# Patient Record
Sex: Female | Born: 1965 | Race: White | Hispanic: No | Marital: Married | Smoking: Former smoker
Health system: Southern US, Community
[De-identification: ages and names within clinical notes are randomized; demographics above are authoritative.]

## PROBLEM LIST (undated history)

## (undated) DIAGNOSIS — M545 Low back pain, unspecified: Secondary | ICD-10-CM

## (undated) DIAGNOSIS — K219 Gastro-esophageal reflux disease without esophagitis: Secondary | ICD-10-CM

## (undated) DIAGNOSIS — F419 Anxiety disorder, unspecified: Secondary | ICD-10-CM

## (undated) DIAGNOSIS — N6019 Diffuse cystic mastopathy of unspecified breast: Secondary | ICD-10-CM

## (undated) DIAGNOSIS — D509 Iron deficiency anemia, unspecified: Secondary | ICD-10-CM

## (undated) DIAGNOSIS — J45909 Unspecified asthma, uncomplicated: Secondary | ICD-10-CM

## (undated) DIAGNOSIS — M1990 Unspecified osteoarthritis, unspecified site: Secondary | ICD-10-CM

## (undated) DIAGNOSIS — J309 Allergic rhinitis, unspecified: Secondary | ICD-10-CM

## (undated) HISTORY — DX: Iron deficiency anemia, unspecified: D50.9

## (undated) HISTORY — DX: Unspecified asthma, uncomplicated: J45.909

## (undated) HISTORY — DX: Diffuse cystic mastopathy of unspecified breast: N60.19

## (undated) HISTORY — DX: Low back pain, unspecified: M54.50

## (undated) HISTORY — DX: Anxiety disorder, unspecified: F41.9

## (undated) HISTORY — DX: Low back pain: M54.5

## (undated) HISTORY — DX: Unspecified osteoarthritis, unspecified site: M19.90

## (undated) HISTORY — DX: Allergic rhinitis, unspecified: J30.9

## (undated) HISTORY — DX: Gastro-esophageal reflux disease without esophagitis: K21.9

---

## 2005-12-11 HISTORY — PX: CHOLECYSTECTOMY: SHX55

## 2006-12-11 HISTORY — PX: OTHER SURGICAL HISTORY: SHX169

## 2014-04-09 ENCOUNTER — Telehealth: Payer: Self-pay | Admitting: Critical Care Medicine

## 2014-04-09 NOTE — Telephone Encounter (Signed)
Pt has an appt scheduled with PW on 04/14/14 at 4:15 pm in [place] for a consult.   PW will not be in office for this appt. I have lmomtcb for pt to see if she can ARRIVE at 3:15 pm for a 3:45 pm appt time with Dr. Nelda Marseille.  Also, when pt calls back, can we please add demographics.

## 2014-04-10 NOTE — Telephone Encounter (Signed)
lmomtcb for pt 

## 2014-04-10 NOTE — Telephone Encounter (Signed)
Spoke with pt.  She can come in on May 5 at 3:15 for 3:45 pm appt with Dr. Nelda Marseille.  She is aware to bring all meds to visit.  She verbalized understanding and voiced no further questions or concerns at this time.

## 2014-04-14 ENCOUNTER — Ambulatory Visit (INDEPENDENT_AMBULATORY_CARE_PROVIDER_SITE_OTHER): Payer: Self-pay | Admitting: Pulmonary Disease

## 2014-04-14 ENCOUNTER — Encounter: Payer: Self-pay | Admitting: Pulmonary Disease

## 2014-04-14 VITALS — BP 110/78 | HR 64 | Ht 62.0 in | Wt 192.4 lb

## 2014-04-14 DIAGNOSIS — R0609 Other forms of dyspnea: Secondary | ICD-10-CM

## 2014-04-14 DIAGNOSIS — K219 Gastro-esophageal reflux disease without esophagitis: Secondary | ICD-10-CM

## 2014-04-14 DIAGNOSIS — R062 Wheezing: Secondary | ICD-10-CM | POA: Insufficient documentation

## 2014-04-14 DIAGNOSIS — R091 Pleurisy: Secondary | ICD-10-CM

## 2014-04-14 MED ORDER — FLUTTER DEVI: Status: AC

## 2014-04-14 NOTE — Progress Notes (Signed)
   Subjective:    Patient ID: Kaitlyn Maldonado, female    DOB: [birth year], 48 y.o.   MRN: [id number]  HPI    Review of Systems  Constitutional: Positive for unexpected weight change. Negative for fever.  HENT: Positive for sneezing and trouble swallowing. Negative for congestion, dental problem, ear pain, nosebleeds, postnasal drip, rhinorrhea, sinus pressure and sore throat.   Eyes: Positive for redness and itching.  Respiratory: Positive for cough and shortness of breath. Negative for chest tightness and wheezing.   Cardiovascular: Positive for chest pain and leg swelling. Negative for palpitations.  Gastrointestinal: Negative for nausea and vomiting.  Genitourinary: Negative for dysuria.  Musculoskeletal: Negative for joint swelling.  Skin: Negative for rash.  Neurological: Positive for headaches.  Hematological: Does not bruise/bleed easily.  Psychiatric/Behavioral: Negative for dysphoric mood. The patient is not nervous/anxious.        Objective:   Physical Exam        Assessment & Plan:

## 2014-04-14 NOTE — Patient Instructions (Signed)
Use flutter valve as advised.  Obtain pulmonary function testing.  Tylenol for CP.  Chest X-ray has been ordered for you.  Continue Mucinex.  Return to clinic in 4 weeks after pulmonary function testing and chest X-ray have been done.

## 2014-04-14 NOTE — Progress Notes (Signed)
Subjective:    Patient ID: Kaitlyn Maldonado, female    DOB: May 03, 1966, 48 y.o.   MRN: [id number]  Shortness of Breath This is a chronic problem. The current episode started more than 1 month ago. The problem occurs constantly. The problem has been unchanged. The average episode lasts 6 months. Associated symptoms include chest pain and sputum production. Pertinent negatives include no abdominal pain, claudication, ear pain, fever, headaches, hemoptysis, leg pain, leg swelling, neck pain, orthopnea, PND, rash, rhinorrhea, sore throat, swollen glands, syncope, vomiting or wheezing. The symptoms are aggravated by exercise and smoke. Associated symptoms comments: - Dull pain at the base on the right when on inhalation, pleuritic in nature. - Thick clear mucous and improves with mucinex. - Clear sputum production.. The patient has no known risk factors for DVT/PE. He has tried beta agonist inhalers for the symptoms. The treatment provided mild relief. His past medical history is significant for pneumonia. There is no history of asthma, bronchiolitis or COPD.  Increase sputum production in the AM.  GERD in AM.  Continously clearing throat.  No post nasal drip.  Seen by allergy doctor and was all negative for testing.  Was given symbicort by her primary doctor with little effect.  Albuterol seems to help with rattle in her chest.  Pleuritic chest pain as above.  Review of Systems  Constitutional: Negative for fever.  HENT: Negative for ear pain, rhinorrhea and sore throat.   Respiratory: Positive for sputum production and shortness of breath. Negative for hemoptysis and wheezing.   Cardiovascular: Positive for chest pain. Negative for orthopnea, claudication, leg swelling, syncope and PND.  Gastrointestinal: Negative for vomiting and abdominal pain.  Musculoskeletal: Negative for neck pain.  Skin: Negative for rash.  Neurological: Negative for headaches.     Objective:   Physical Exam Filed Vitals:   04/14/14 1617  BP: 110/78  Pulse: 64  Height: 5\' 2"  (1.575 m)  Weight: 192 lb 6.4 oz (87.272 kg)  SpO2: 97%    Gen: Pleasant, well-nourished, in no distress,  normal affect  ENT: No lesions,  mouth clear,  oropharynx clear, no postnasal drip  Neck: No JVD, no TMG, no carotid bruits  Lungs: No use of accessory muscles, no dullness to percussion, rales bibabasaly.  Minimal wheezing that clears with deep cough.  Cardiovascular: RRR, heart sounds normal, no murmur or gallops, no peripheral edema  Abdomen: soft and NT, no HSM,  BS normal  Musculoskeletal: No deformities, no cyanosis or clubbing  Neuro: alert, non focal  Skin: Warm, no lesions or rashes  No results found.        Assessment & Plan:   48 year old female with no significant pulmonary history who had 2 bouts of pneumonia last summer and ever since then has been complaining of rattle in her chest that has gotten worse c/o pleurisy since.  Patient has a very strong reflux history, previous EGD and multiple anti-reflux drugs.  I am concerned that all her symptoms are related to reflux and are not at all related to pulmonary disease perse.  Having said that she does have a smoking history.  I feel this is an upper airway cough syndrome.  Will start by evaluating for obstructive lung disease, she is already on BID PPI and H2 blocker.  If no obstructive lung disease then will add a nasal steroid but patient has been on a nasal steroid in the past with little effect.  Plan: - Need to R/O any inherent pulmonary  disease.  Will order full PFTs. - With regards to pleurisy, I believe that the pleurisy is related to frequent cough induced rib pain, therefore advised to use tylenol as the patient has history of gastric ulcers in the past.  If having to use any NSAID then enteric coated only and on a full stomach. - Repeat CXR to insure resolution of PNA and that there is no pleural effusion that can be the cause of the pleurisy. - If  PFTs are normal and CXR is clear then will need to re-evaluate her GERD and her pyloric sphincter function via seeing a GI doctor. - If no obstructive lung disease then will add a nasal steroid. - Also advised to use a flutter valve to help with mucous clearing specially in the morning.  Rush Farmer, M.D. [facility].

## 2014-04-15 ENCOUNTER — Telehealth: Payer: Self-pay | Admitting: Pulmonary Disease

## 2014-04-15 NOTE — Telephone Encounter (Signed)
Spoke with patient- she states the Flutter Device Rx she was given is not covered by her insurance at the pharmacy. She states Crystal told her to call our office if she had any trouble and she could get her one form the [place] office. Pt would like to get one from [place] office but pick it up in [place] office. Pt is aware that I am speaking with Crystal to find out when they will be back in the [place] office. Pt requests we call and leave a detailed message on phone number provided or call her after 3pm today. Crystal please advise. Thanks.

## 2014-04-15 NOTE — Telephone Encounter (Signed)
LMTC x 1  

## 2014-04-15 NOTE — Telephone Encounter (Signed)
I ATC Sherry - line rang several times with no option for VM to personally verify msg with Judeen Hammans. WCB  Once I speak with Judeen Hammans, I will call Dr. Nelda Marseille.

## 2014-04-16 NOTE — Telephone Encounter (Signed)
I spoke with Dr. Nelda Marseille.  He is ok with [facility] proceeding with a Full PFT only right now as long as they do a pre and post. Spoke with Judeen Hammans.  Informed her of above.  She verbalized understanding and states no new order is needed.  States she will make a comment of this on the current order form they have. Nothing further needed. Will sign off.

## 2014-04-16 NOTE — Telephone Encounter (Signed)
I spoke with Judeen Hammans with [facility]FT lab - they do not have a policy and procedure in place to be able to do methacholine test on Friday for this pt.  Would we like them to do a full PFT only or does pt need to be referred elsewhere for this test?  Advised I would speak with Dr. Nelda Marseille later today and would call her back.  She is ok with this.  Sherry's office # [phone]

## 2014-04-22 NOTE — Telephone Encounter (Signed)
Crystal please advise thanks

## 2014-04-22 NOTE — Telephone Encounter (Signed)
lmomtcb for pt.  We are not back in [place] until Tuesday, May 19.  I will ensure we have a flutter valve in the office on that day.  Can pt come in at 8:45 to pick up and be shown how to use this?  Also, can she bring rx with her so we can sent to DME.

## 2014-04-22 NOTE — Telephone Encounter (Signed)
Pt returned call. Please call back after 3

## 2014-04-22 NOTE — Telephone Encounter (Signed)
lmomtcb x1 for pt 

## 2014-04-22 NOTE — Telephone Encounter (Signed)
Spoke with pt and offered her to come into the [place] office to obtain flutter valve sooner. Pt stated she would wait and come to [place] office 04/28/14 @ 8:45 and brind rx for flutter with her.   Pt had chest cxr done in at [facility] on 04/15/14 and she is requesting these results.  Pt request to be called at the number below after 3:00  Will forward message to crystal to follow up with JY on results.

## 2014-04-22 NOTE — Telephone Encounter (Signed)
Pt request call back after 3. cb at [phone].

## 2014-04-23 NOTE — Telephone Encounter (Signed)
I spoke with Dr. Nelda Marseille. He will call pt regarding cxr results.

## 2014-04-24 LAB — PULMONARY FUNCTION TEST

## 2014-04-27 NOTE — Telephone Encounter (Signed)
Dr. Nelda Marseille has reviewed pt's cxr results.  Per JY:  Call pt, please let her know cxr is normal.  Called, spoke with pt.  Informed her of cxr results per JY.  She verbalized understanding and voiced no further questions or concerns at this time.  Pt will still come by [place] office in the morning to pick up flutter valve.  Will route to [place] to review and sign off on cxr results.

## 2014-04-28 NOTE — Telephone Encounter (Signed)
Pt  Came by [place] office today to pick up flutter valve.  She was shown how to use this.  She verbalized understanding and voiced no further questions or concerns at this time.  Will route back to [place] to sign off on cxr results.

## 2014-05-06 ENCOUNTER — Telehealth: Payer: Self-pay | Admitting: Pulmonary Disease

## 2014-05-06 NOTE — Telephone Encounter (Signed)
Pt had PFTs done on 04/24/14 at [facility].  Dr. Nelda Marseille has reviewed this results. Per JY: Call pt, let her know these results are normal. lmomtcb for pt PFT results placed in scan folder.

## 2014-05-06 NOTE — Telephone Encounter (Signed)
Please advise if this has been taken care of. Thanks.  

## 2014-05-06 NOTE — Telephone Encounter (Signed)
Yes, Dr. Nelda Marseille has reviewed msg and cxr results.  Nothing further needed.  Will sign off.

## 2014-05-07 NOTE — Telephone Encounter (Signed)
Called, spoke with pt.  Informed her of pft results per Dr. Nelda Marseille.  She verbalized understanding and voiced no further questions or concerns at this time.

## 2014-05-11 ENCOUNTER — Telehealth: Payer: Self-pay | Admitting: *Deleted

## 2014-05-11 NOTE — Telephone Encounter (Signed)
Made pt an appt on Thursday to see Nelda Marseille.

## 2014-05-11 NOTE — Telephone Encounter (Signed)
lmomtcb for pt to schedule 4 wk follow up.  Dr. Nelda Marseille will be in [place] on this Thursday, June 4.  Can pt come in for f/u then?  She has already had cxr and pfts done.

## 2014-05-11 NOTE — Telephone Encounter (Signed)
Message copied by Adalberto Ill on Mon May 11, 2014  9:07 AM ------      Message from: Raymondo Band D      Created: Tue Apr 14, 2014  5:38 PM      Regarding: needs ov       Needs OV with yacoub in 4 wks from 04/14/14.      PFT and cxr needs to be done prior to appt. ------

## 2014-05-14 ENCOUNTER — Ambulatory Visit: Payer: Self-pay | Admitting: Pulmonary Disease

## 2014-05-19 ENCOUNTER — Encounter: Payer: Self-pay | Admitting: Critical Care Medicine

## 2014-05-19 ENCOUNTER — Ambulatory Visit (INDEPENDENT_AMBULATORY_CARE_PROVIDER_SITE_OTHER): Payer: Self-pay | Admitting: Critical Care Medicine

## 2014-05-19 VITALS — BP 130/80 | HR 62 | Temp 98.2°F | Ht 62.5 in | Wt 193.2 lb

## 2014-05-19 DIAGNOSIS — R0609 Other forms of dyspnea: Secondary | ICD-10-CM

## 2014-05-19 DIAGNOSIS — J309 Allergic rhinitis, unspecified: Secondary | ICD-10-CM | POA: Insufficient documentation

## 2014-05-19 NOTE — Patient Instructions (Signed)
Refrigerate fish oil and reduce to one daily Can stop proair Stay on stomach medication and antihistamines Return as needed

## 2014-05-19 NOTE — Assessment & Plan Note (Signed)
Dyspnea due to prior PNA now resolved PFTs normal. CXR clear No primary lung disease at present Plan Refrigerate fish oil and reduce to one daily Can stop proair Stay on PPI and antihistamines Return as needed

## 2014-05-19 NOTE — Progress Notes (Signed)
Subjective:    Patient ID: Kaitlyn Maldonado, female    DOB: 29-May-1966, 48 y.o.   MRN: [id number]  HPIIncrease sputum production in the AM.  GERD in AM.  Continously clearing throat.  No post nasal drip.  Seen by allergy doctor and was all negative for testing.  Was given symbicort by her primary doctor with little effect.  Albuterol seems to help with rattle in her chest.  Pleuritic chest pain as above.  05/19/2014 Chief Complaint  Patient presents with  . Follow-up    saw Dr. Nelda Marseille last mth.,feeling better 75%,had UTI went to Urgent Care, finished abx Augmentin 3 days ago, flutter device helping,mild sob,dry cough, no wheeze,chest tightness midchest,still hurts when breathes deep under bilat. ribs   Last seen one month ago and rec was : 48 year old female with no significant pulmonary history who had 2 bouts of pneumonia last summer and ever since then has been complaining of rattle in her chest that has gotten worse c/o pleurisy since.  Patient has a very strong reflux history, previous EGD and multiple anti-reflux drugs.  I am concerned that all her symptoms are related to reflux and are not at all related to pulmonary disease perse.  Having said that she does have a smoking history.  I feel this is an upper airway cough syndrome.  Will start by evaluating for obstructive lung disease, she is already on BID PPI and H2 blocker.  If no obstructive lung disease then will add a nasal steroid but patient has been on a nasal steroid in the past with little effect.  Plan: - Need to R/O any inherent pulmonary disease.  Will order full PFTs. - With regards to pleurisy, I believe that the pleurisy is related to frequent cough induced rib pain, therefore advised to use tylenol as the patient has history of gastric ulcers in the past.  If having to use any NSAID then enteric coated only and on a full stomach. - Repeat CXR to insure resolution of PNA and that there is no pleural effusion that can be the cause  of the pleurisy. - If PFTs are normal and CXR is clear then will need to re-evaluate her GERD and her pyloric sphincter function via seeing a GI doctor. - If no obstructive lung disease then will add a nasal steroid. - Also advised to use a flutter valve to help with mucous clearing specially in the morning.  No further pleurisy. Pt still with tightness with cough.  Still dry cough. Pt saw allergy MD and ? Stomach related No heartburn now, but on med for same. No swallow issues. Notes some pndrip  Review of Systems   Objective:   Physical Exam  Filed Vitals:   05/19/14 0852  BP: 130/80  Pulse: 62  Temp: 98.2 F (36.8 C)  TempSrc: Oral  Height: 5' 2.5" (1.588 m)  Weight: 193 lb 3.2 oz (87.635 kg)  SpO2: 96%    Gen: Pleasant, well-nourished, in no distress,  normal affect  ENT: No lesions,  mouth clear,  oropharynx clear, no postnasal drip  Neck: No JVD, no TMG, no carotid bruits  Lungs: No use of accessory muscles, no dullness to percussion, rales bibabasaly.  Minimal wheezing that clears with deep cough.  Cardiovascular: RRR, heart sounds normal, no murmur or gallops, no peripheral edema  Abdomen: soft and NT, no HSM,  BS normal  Musculoskeletal: No deformities, no cyanosis or clubbing  Neuro: alert, non focal  Skin: Warm, no lesions  or rashes  No results found.        Assessment & Plan:   DOE (dyspnea on exertion) Dyspnea due to prior PNA now resolved PFTs normal. CXR clear No primary lung disease at present Plan Refrigerate fish oil and reduce to one daily Can stop proair Stay on PPI and antihistamines Return as needed     Updated Medication List Outpatient Encounter Prescriptions as of 05/19/2014  Medication Sig  . albuterol (PROAIR HFA) 108 (90 BASE) MCG/ACT inhaler Inhale 2 puffs into the lungs every 6 (six) hours as needed for wheezing or shortness of breath.  . Cholecalciferol (VITAMIN D) 2000 UNITS CAPS Take by mouth daily.  . Fe  Fum-FePoly-Vit C-Vit B3 (INTEGRA PO) Take by mouth daily.  . lansoprazole (PREVACID) 30 MG capsule Take 30 mg by mouth 2 (two) times daily.  Marland Kitchen loratadine (CLARITIN) 10 MG tablet Take 10 mg by mouth 2 (two) times daily.  . montelukast (SINGULAIR) 10 MG tablet Take 10 mg by mouth at bedtime.  . Omega-3 Fatty Acids (FISH OIL) 1200 MG CAPS Take by mouth 2 (two) times daily.  . ranitidine (ZANTAC) 300 MG tablet Take 300 mg by mouth at bedtime.  Marland Kitchen Respiratory Therapy Supplies (FLUTTER) DEVI Use as directed four times daily  . traMADol (ULTRAM) 50 MG tablet Take 50 mg by mouth 3 (three) times daily as needed.  . meloxicam (MOBIC) 7.5 MG tablet Take 7.5 mg by mouth 2 (two) times daily.  Marland Kitchen orlistat (ALLI) 60 MG capsule Take 120-180 mg by mouth 3 (three) times daily with meals.

## 2014-06-11 ENCOUNTER — Encounter: Payer: Self-pay | Admitting: Critical Care Medicine

## 2015-09-16 ENCOUNTER — Other Ambulatory Visit: Payer: Self-pay | Admitting: Physician Assistant

## 2015-09-16 DIAGNOSIS — M5417 Radiculopathy, lumbosacral region: Secondary | ICD-10-CM

## 2015-10-02 ENCOUNTER — Ambulatory Visit
Admission: RE | Admit: 2015-10-02 | Discharge: 2015-10-02 | Disposition: A | Discharge status: Home / Self Care | Payer: Managed Care, Other (non HMO) | Source: Ambulatory Visit | Attending: Physician Assistant | Admitting: Physician Assistant

## 2015-10-02 DIAGNOSIS — M5417 Radiculopathy, lumbosacral region: Secondary | ICD-10-CM

## 2015-11-08 ENCOUNTER — Ambulatory Visit: Payer: Managed Care, Other (non HMO) | Admitting: Podiatry

## 2016-03-22 DIAGNOSIS — D473 Essential (hemorrhagic) thrombocythemia: Secondary | ICD-10-CM | POA: Diagnosis not present

## 2016-03-29 ENCOUNTER — Encounter: Payer: Self-pay | Admitting: Sports Medicine

## 2016-03-29 ENCOUNTER — Ambulatory Visit (INDEPENDENT_AMBULATORY_CARE_PROVIDER_SITE_OTHER): Payer: Managed Care, Other (non HMO)

## 2016-03-29 ENCOUNTER — Ambulatory Visit (INDEPENDENT_AMBULATORY_CARE_PROVIDER_SITE_OTHER): Payer: Managed Care, Other (non HMO) | Admitting: Sports Medicine

## 2016-03-29 DIAGNOSIS — M722 Plantar fascial fibromatosis: Secondary | ICD-10-CM

## 2016-03-29 DIAGNOSIS — M79673 Pain in unspecified foot: Secondary | ICD-10-CM

## 2016-03-29 MED ORDER — TRIAMCINOLONE ACETONIDE 10 MG/ML IJ SUSP: 10.0000 mg | Freq: Once | INTRAMUSCULAR | Status: AC

## 2016-03-29 MED ORDER — METHYLPREDNISOLONE 4 MG PO TBPK: ORAL_TABLET | ORAL | Status: DC

## 2016-03-29 NOTE — Patient Instructions (Signed)

## 2016-03-29 NOTE — Progress Notes (Signed)
Patient ID: Kaitlyn Maldonado, female   DOB: 07-09-1966, 50 y.o.   MRN: [id number] Subjective: Kaitlyn Maldonado is a 50 y.o. female patient presents to office with complaint of heel pain on the left and right. 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 Patient has treated this problem with elastic sock which helped some. Patient reports that because of pain in heels she has changed the way she walks and occasionally has pain at lateral foot and ball R>L.   Patient Active Problem List   Diagnosis Date Noted  . Allergic rhinitis 05/19/2014  . GERD (gastroesophageal reflux disease) 04/14/2014  . DOE (dyspnea on exertion) 04/14/2014    Current Outpatient Prescriptions on File Prior to Visit  Medication Sig Dispense Refill  . albuterol (PROAIR HFA) 108 (90 BASE) MCG/ACT inhaler Inhale 2 puffs into the lungs every 6 (six) hours as needed for wheezing or shortness of breath.    . Cholecalciferol (VITAMIN D) 2000 UNITS CAPS Take by mouth daily.    . Fe Fum-FePoly-Vit C-Vit B3 (INTEGRA PO) Take by mouth daily.    . lansoprazole (PREVACID) 30 MG capsule Take 30 mg by mouth 2 (two) times daily.    Marland Kitchen loratadine (CLARITIN) 10 MG tablet Take 10 mg by mouth 2 (two) times daily.    . meloxicam (MOBIC) 7.5 MG tablet Take 7.5 mg by mouth 2 (two) times daily.    . montelukast (SINGULAIR) 10 MG tablet Take 10 mg by mouth at bedtime.    . Omega-3 Fatty Acids (FISH OIL) 1200 MG CAPS Take by mouth 2 (two) times daily.    . Ondansetron HCl (ZOFRAN PO) Take by mouth as needed.    Marland Kitchen orlistat (ALLI) 60 MG capsule Take 120-180 mg by mouth 3 (three) times daily with meals.    . ranitidine (ZANTAC) 300 MG tablet Take 300 mg by mouth at bedtime.    Marland Kitchen Respiratory Therapy Supplies (FLUTTER) DEVI Use as directed four times daily 1 each 0  . traMADol (ULTRAM) 50 MG tablet Take 50 mg by mouth 3 (three) times daily as needed.     No current facility-administered medications on file prior to visit.    Allergies   Allergen Reactions  . Keflex [Cephalexin]     Rash, dizziness  . Sulfa Antibiotics     rash    Objective: Physical Exam General: The patient is alert and oriented x3 in no acute distress.  Dermatology: Skin is warm, dry and supple bilateral lower extremities. Nails 1-10 are normal. There is no erythema, edema, no eccymosis, no open lesions present. Integument is otherwise unremarkable.  Vascular: Dorsalis Pedis pulse and Posterior Tibial pulse are 2/4 bilateral. Capillary fill time is immediate to all digits.  Neurological: Grossly intact to light touch with an achilles reflex of +2/5 and a  negative Tinel's sign bilateral.  Musculoskeletal: Tenderness to palpation at the medial calcaneal tubercale and through the insertion of the plantar fascia on the left and right foot. No pain with compression of calcaneus bilateral. No pain with tuning fork to calcaneus bilateral. No pain with calf compression bilateral.  All  joints range of motion within normal limits bilateral. Strength 5/5 in all groups bilateral.   Xray, Right and Left foot:  Normal osseous mineralization. Joint spaces preserved. No fracture/dislocation/boney destruction. Mild hammertoe, Calcaneal spur present with mild thickening of plantar fascia. No other soft tissue abnormalities or radiopaque foreign bodies.   Assessment and Plan: Problem List Items Addressed This Visit  None    Visit Diagnoses    Foot pain, unspecified laterality    -  Primary    Relevant Medications    triamcinolone acetonide (KENALOG) 10 MG/ML injection 10 mg    methylPREDNISolone (MEDROL DOSEPAK) 4 MG TBPK tablet    Other Relevant Orders    DG Foot 2 Views Left    DG Foot 2 Views Right    Plantar fasciitis, bilateral        Relevant Medications    triamcinolone acetonide (KENALOG) 10 MG/ML injection 10 mg    methylPREDNISolone (MEDROL DOSEPAK) 4 MG TBPK tablet       -Complete examination performed. Discussed with patient in detail the  condition of plantar fasciitis, how this occurs and general treatment options. Explained both conservative and surgical treatments.  -After oral consent and aseptic prep, injected a mixture containing 1 ml of 2%  plain lidocaine, 1 ml 0.5% plain marcaine, 0.5 ml of kenalog 10 and 0.5 ml of dexamethasone phosphate into left and right heel. Post-injection care discussed with patient.  -Rx Medrol dose pack once completed patient to take Mobic of which she already has -Recommended good supportive shoes and advised use of OTC insert. Explained to patient that if these orthoses work well, we will continue with these. If these do not improve her condition and  pain, we will consider custom molded orthoses. -Explained in detail the use of the fascial braces which was dispensed at today's visit. -Explained and dispensed to patient daily stretching exercises. -Recommend patient to ice affected area 1-2x daily. -Patient to return to office in 3 weeks for follow up or sooner if problems or questions arise.  Kaitlyn Maldonado, DPM

## 2016-04-19 ENCOUNTER — Encounter: Payer: Self-pay | Admitting: Sports Medicine

## 2016-04-19 ENCOUNTER — Ambulatory Visit (INDEPENDENT_AMBULATORY_CARE_PROVIDER_SITE_OTHER): Payer: Managed Care, Other (non HMO) | Admitting: Sports Medicine

## 2016-04-19 DIAGNOSIS — M79673 Pain in unspecified foot: Secondary | ICD-10-CM

## 2016-04-19 DIAGNOSIS — M722 Plantar fascial fibromatosis: Secondary | ICD-10-CM | POA: Diagnosis not present

## 2016-04-19 MED ORDER — TRIAMCINOLONE ACETONIDE 10 MG/ML IJ SUSP: 10.0000 mg | Freq: Once | INTRAMUSCULAR | Status: AC

## 2016-04-19 NOTE — Progress Notes (Signed)
Patient ID: Kaitlyn Maldonado, female   DOB: 12-Nov-1966, 50 y.o.   MRN: [id number] Subjective: Kaitlyn Maldonado is a 50 y.o. female returns to office for follow up evaluation after Left and Right heel injection for plantar fasciitis, injection #1 administered 3 weeks ago. Patient states that the injection seems to help her pain for about 2 weeks but heels still hurt after working more hours now. Patient denies any recent changes in medications or new problems since last visit.   Patient Active Problem List   Diagnosis Date Noted  . Allergic rhinitis 05/19/2014  . GERD (gastroesophageal reflux disease) 04/14/2014  . DOE (dyspnea on exertion) 04/14/2014    Current Outpatient Prescriptions on File Prior to Visit  Medication Sig Dispense Refill  . albuterol (PROAIR HFA) 108 (90 BASE) MCG/ACT inhaler Inhale 2 puffs into the lungs every 6 (six) hours as needed for wheezing or shortness of breath.    . Cholecalciferol (VITAMIN D) 2000 UNITS CAPS Take by mouth daily.    . clobetasol (TEMOVATE) 0.05 % external solution APLY TO DAMP SCALP BID FOR 1 TO 2 WEEKS PRN  1  . cyclobenzaprine (FLEXERIL) 5 MG tablet     . dicyclomine (BENTYL) 20 MG tablet     . doxycycline (VIBRAMYCIN) 100 MG capsule TK 1 C PO BID  0  . Fe Fum-FePoly-Vit C-Vit B3 (INTEGRA PO) Take by mouth daily.    . lansoprazole (PREVACID) 30 MG capsule Take 30 mg by mouth 2 (two) times daily.    Marland Kitchen loratadine (CLARITIN) 10 MG tablet Take 10 mg by mouth 2 (two) times daily.    . meloxicam (MOBIC) 7.5 MG tablet Take 7.5 mg by mouth 2 (two) times daily.    . methylPREDNISolone (MEDROL DOSEPAK) 4 MG TBPK tablet Take as instructed 21 tablet 0  . montelukast (SINGULAIR) 10 MG tablet Take 10 mg by mouth at bedtime.    . Omega-3 Fatty Acids (FISH OIL) 1200 MG CAPS Take by mouth 2 (two) times daily.    . Ondansetron HCl (ZOFRAN PO) Take by mouth as needed.    Marland Kitchen orlistat (ALLI) 60 MG capsule Take 120-180 mg by mouth 3 (three) times daily with meals.    .  pantoprazole (PROTONIX) 40 MG tablet     . phentermine (ADIPEX-P) 37.5 MG tablet TK 1 T PO QAM  0  . predniSONE (STERAPRED UNI-PAK 21 TAB) 5 MG (21) TBPK tablet TK UTD FOR 6 DAYS  0  . ranitidine (ZANTAC) 300 MG tablet Take 300 mg by mouth at bedtime.    Marland Kitchen Respiratory Therapy Supplies (FLUTTER) DEVI Use as directed four times daily 1 each 0  . SYNTHROID 50 MCG tablet     . traMADol (ULTRAM) 50 MG tablet Take 50 mg by mouth 3 (three) times daily as needed.    . triamcinolone cream (KENALOG) 0.5 % APP EXT AA BID  0  . triamcinolone ointment (KENALOG) 0.1 % APP TO ARMS BID PRF ITCHING  1   Current Facility-Administered Medications on File Prior to Visit  Medication Dose Route Frequency Provider Last Rate Last Dose  . triamcinolone acetonide (KENALOG) 10 MG/ML injection 10 mg  10 mg Other Once [name], DPM        Allergies  Allergen Reactions  . Keflex [Cephalexin]     Rash, dizziness  . Sulfa Antibiotics     rash    Objective:   General:  Alert and oriented x 3, in no acute distress  Dermatology: Skin is warm,  dry, and supple bilateral. Nails are within normal limits. There is no lower extremity erythema, no eccymosis, no open lesions present bilateral.   Vascular: Dorsalis Pedis and Posterior Tibial pedal pulses are 2/4 bilateral. + hair growth noted bilateral. Capillary Fill Time is 3 seconds in all digits. No varicosities, No edema bilateral lower extremities.   Neurological: Sensation grossly intact to light touch with an achilles reflex of +2 and a  negative Tinel's sign bilateral. Vibratory, sharp/dull, Semmes Weinstein Monofilament within normal limits.   Musculoskeletal: There is decreased tenderness to palpation at the medial calcaneal tubercale and through the insertion of the plantar fascia on the Left and right foot. No pain with compression to calcaneus or application of tuning fork. There is decreased Ankle joint range of motion bilateral. All other jointsrange of  motion  within normal limits bilateral. Strength 5/5 bilateral.   Assessment and Plan: Problem List Items Addressed This Visit    None    Visit Diagnoses    Plantar fasciitis, bilateral    -  Primary    Foot pain, unspecified laterality           -Complete examination performed.  -Previous x-rays reviewed. -Discussed with patient in detail the condition of plantar fasciitis, how this  occurs related to the foot type of the patient and general treatment options. - Patient opted for another injection today; injection #2 to heels; After oral consent and aseptic prep, injected a mixture containing 0.5 ml of 1%plain lidocaine, 0.5 ml 0.25% plain marcaine, 0.5 ml of kenalog 10 and 0.5 ml of dexmethasone phosphate to left and right heel at area of most pain/trigger point injection. -Dispensed night splint x 1 to alternate as instructed between both sides -Continue with mobic -Continue with fascial brace or compression sock and stretching, icing, good supportive shoes, inserts daily.  -Discussed long term care and reocurrence; will closely monitor; if fails to improve will consider other treatment modalities.  -Patient to return to office in 1 month for follow up or sooner if problems or questions arise.  [name], DPM

## 2016-05-10 ENCOUNTER — Ambulatory Visit: Payer: Managed Care, Other (non HMO) | Admitting: Sports Medicine

## 2017-07-29 IMAGING — MR MR LUMBAR SPINE W/O CM
4 of 5 series · 26 of 48 positions shown · non-contrast
Comparison: None.

CLINICAL DATA: Chronic central as low back pain and numbness
predominantly affecting the right lower extremity.

EXAM:
MRI LUMBAR SPINE WITHOUT CONTRAST
TECHNIQUE: Multiplanar, multisequence MR imaging of the lumbar spine was
performed. No intravenous contrast was administered.

[Series 3: T2 · sagittal · 4.0mm · 0.55mm/px · 6 of 13 slices shown (1 of 2)]
[im 1/13]
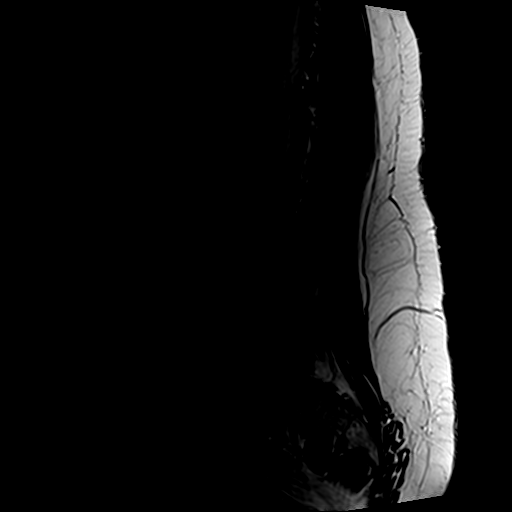
[im 3/13]
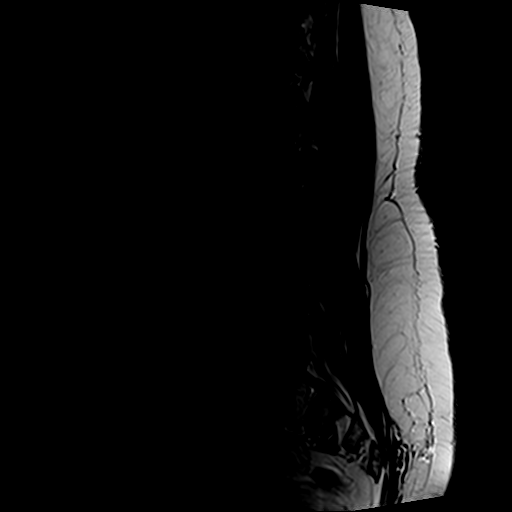
[im 5/13]
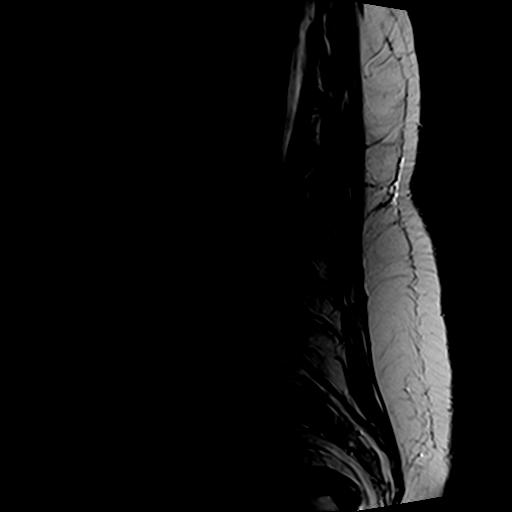
[im 8/13]
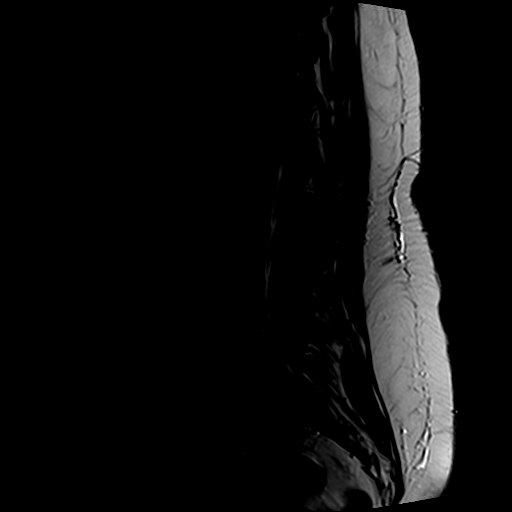
[im 10/13]
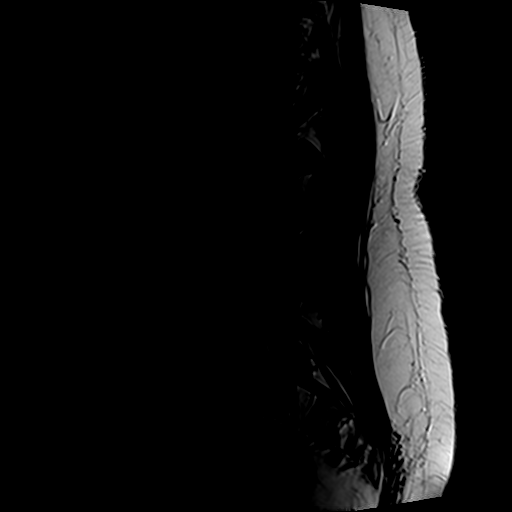
[im 13/13]
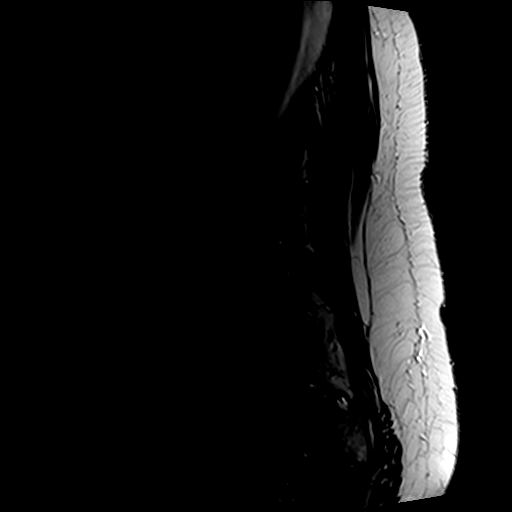

[Series 4: T1 · sagittal · 4.0mm · 0.55mm/px · 6 of 13 slices shown (1 of 2)]
[im 1/13]
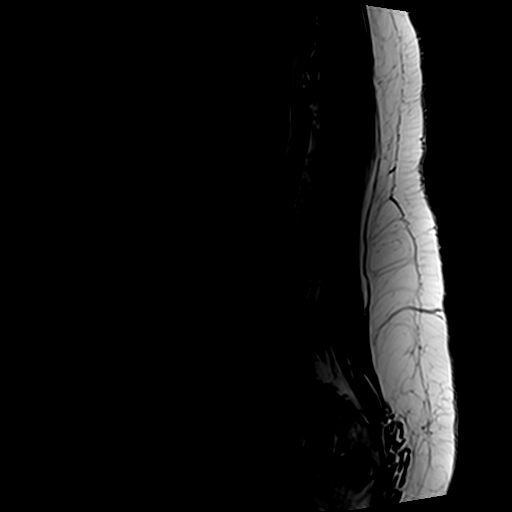
[im 3/13]
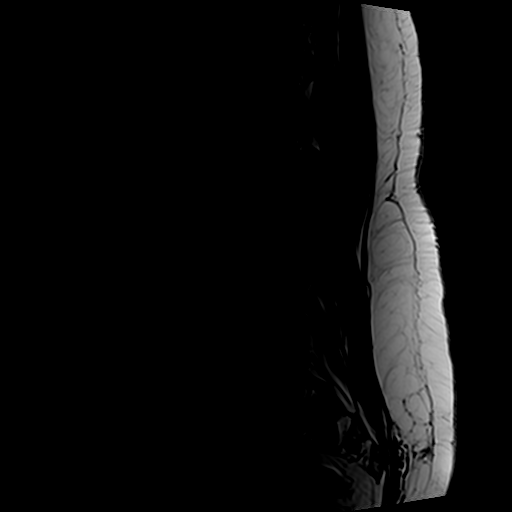
[im 5/13]
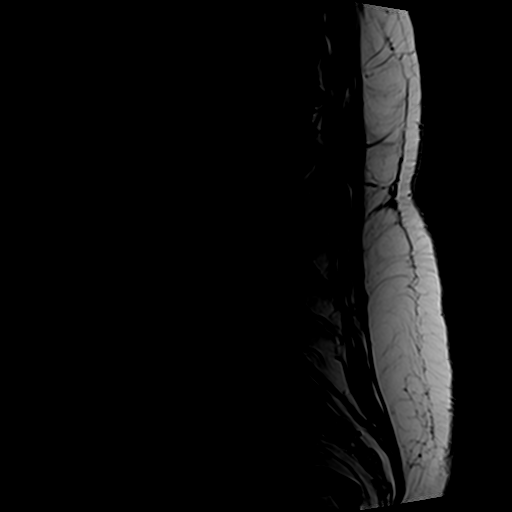
[im 8/13]
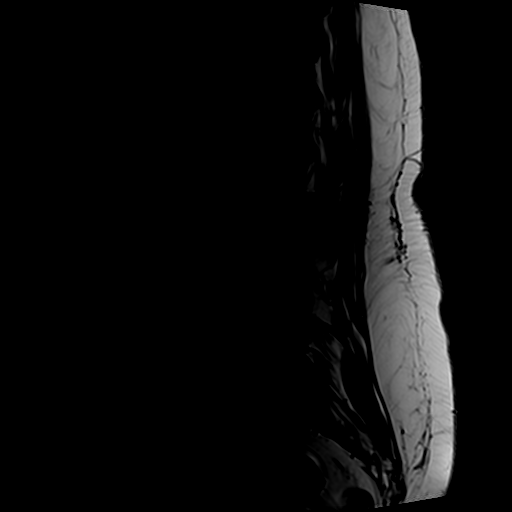
[im 10/13]
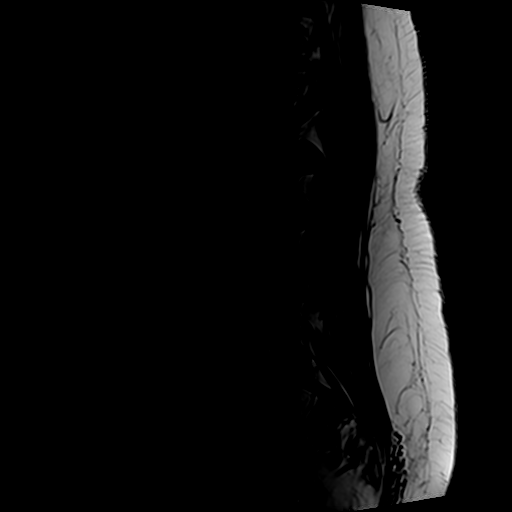
[im 13/13]
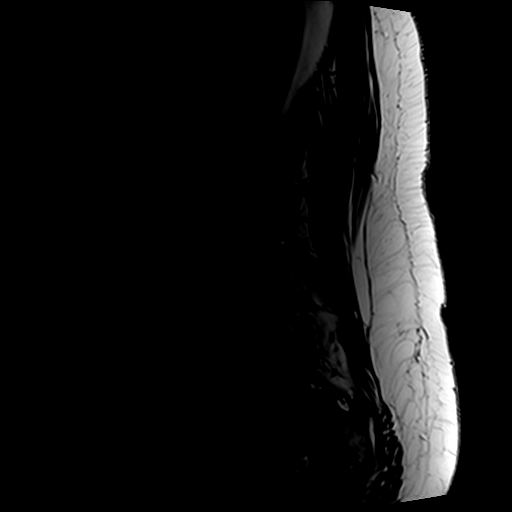

[Series 6: T2 · axial · 4.0mm · 0.70mm/px · z∈[-76,+97]mm · 9 of 32 slices shown (2 of 2)]
[im 1/32]
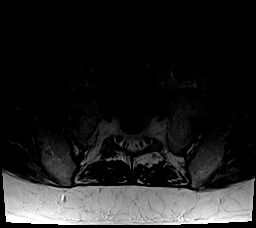
[im 5/32]
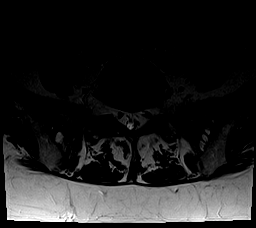
[im 9/32]
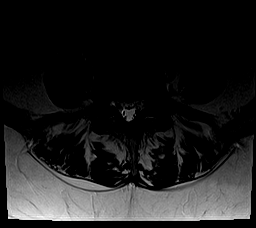
[im 14/32]
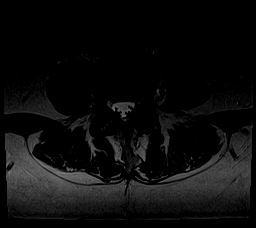
[im 16/32]
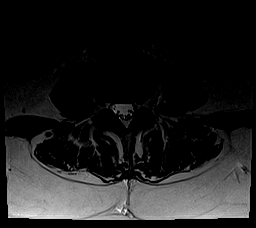
[im 18/32]
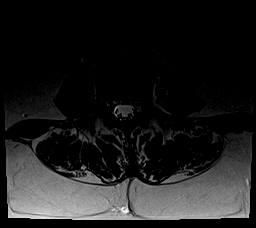
[im 23/32]
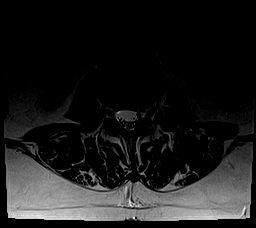
[im 27/32]
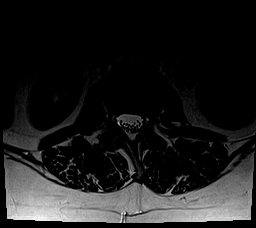
[im 32/32]
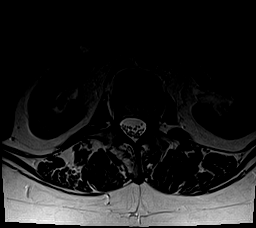

[Series 7: T1 · axial · 4.0mm · 0.35mm/px · z∈[-76,+72]mm · 5 of 32 slices shown (2 of 2)]
[im 1/32]
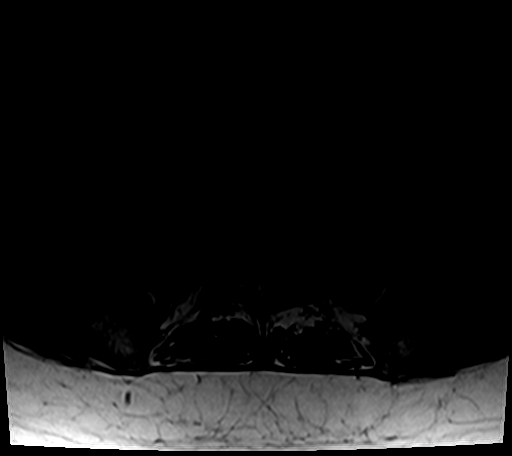
[im 5/32]
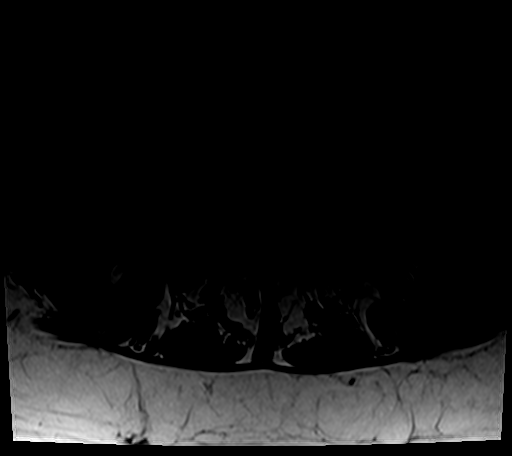
[im 9/32]
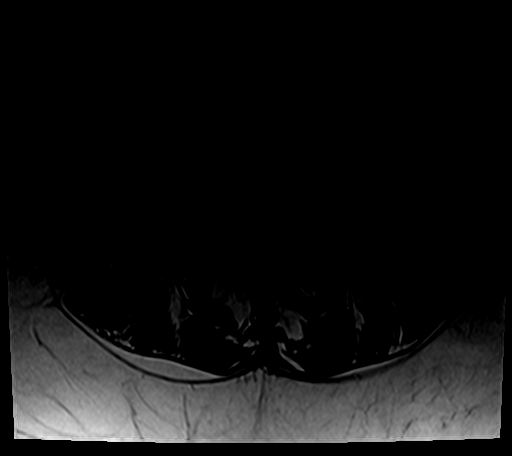
[im 16/32]
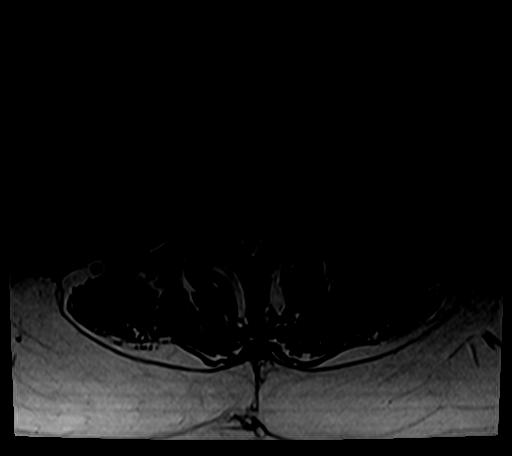
[im 27/32]
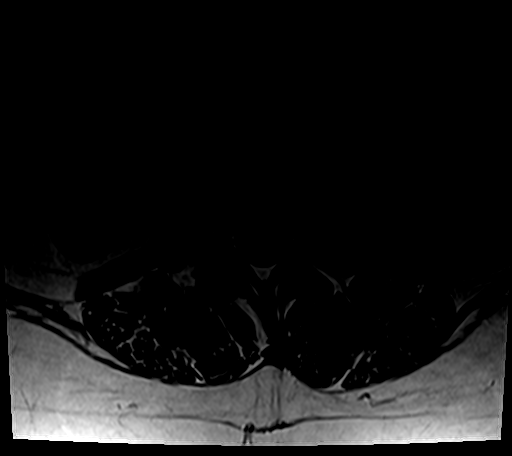

[26 of 48 positions shown; findings below may reference images not displayed]

FINDINGS: Normal signal is present a conus medullaris which terminates at
T12-L1. Marrow signal, vertebral body heights, and alignment are
normal.

Limited imaging of the abdomen is within normal limits. No
significant adenopathy is present.

L1-2:  Negative.

L2-3: A far left lateral disc protrusion and annular tear are noted.
There is encroachment into the left neural foramen without
significant stenosis.

L3-4:  Negative.

L4-5: A central disc protrusion and annular tear present. This
results in mild subarticular stenosis, left greater than right. The
foramina are patent.

L5-S1: Mild facet hypertrophy is present. There is no significant
disc protrusion or stenosis.
IMPRESSION: 1. Far left lateral disc protrusion and annular tear at L2-3 without
significant stenosis.
2. Central disc protrusion and annular tear with mild subarticular
narrowing, left greater than right at L4-5.
3. No significant focal right-sided disease other than L4-5 to
explain right lower extremity radicular symptoms.

## 2021-07-12 DIAGNOSIS — R0600 Dyspnea, unspecified: Secondary | ICD-10-CM

## 2024-02-19 ENCOUNTER — Encounter (HOSPITAL_BASED_OUTPATIENT_CLINIC_OR_DEPARTMENT_OTHER): Payer: Self-pay | Admitting: Emergency Medicine

## 2024-02-19 ENCOUNTER — Ambulatory Visit (HOSPITAL_BASED_OUTPATIENT_CLINIC_OR_DEPARTMENT_OTHER)
Admission: EM | Admit: 2024-02-19 | Discharge: 2024-02-19 | Disposition: A | Discharge status: Home / Self Care | Attending: Family Medicine | Admitting: Family Medicine

## 2024-02-19 ENCOUNTER — Other Ambulatory Visit (HOSPITAL_BASED_OUTPATIENT_CLINIC_OR_DEPARTMENT_OTHER): Payer: Self-pay

## 2024-02-19 DIAGNOSIS — R109 Unspecified abdominal pain: Secondary | ICD-10-CM | POA: Diagnosis not present

## 2024-02-19 DIAGNOSIS — J019 Acute sinusitis, unspecified: Secondary | ICD-10-CM | POA: Diagnosis not present

## 2024-02-19 DIAGNOSIS — R42 Dizziness and giddiness: Secondary | ICD-10-CM | POA: Insufficient documentation

## 2024-02-19 DIAGNOSIS — R300 Dysuria: Secondary | ICD-10-CM | POA: Insufficient documentation

## 2024-02-19 LAB — POCT URINALYSIS DIP (MANUAL ENTRY)
Bilirubin, UA: NEGATIVE
Glucose, UA: NEGATIVE mg/dL
Ketones, POC UA: NEGATIVE mg/dL
Leukocytes, UA: NEGATIVE
Nitrite, UA: NEGATIVE
Protein Ur, POC: NEGATIVE mg/dL
Spec Grav, UA: 1.02
Urobilinogen, UA: 0.2 U/dL
pH, UA: 5.5

## 2024-02-19 LAB — POC COVID19/FLU A&B COMBO
Covid Antigen, POC: NEGATIVE
Influenza A Antigen, POC: NEGATIVE
Influenza B Antigen, POC: NEGATIVE

## 2024-02-19 MED ORDER — AZITHROMYCIN 250 MG PO TABS
ORAL_TABLET | ORAL | 0 refills | Status: AC
  Filled 2024-02-19: qty 6, 5d supply, fill #0

## 2024-02-19 NOTE — ED Provider Notes (Signed)
 [facility]    CSN: [id number] Arrival date & time: 02/19/24  1257      History   Chief Complaint Chief Complaint  Patient presents with   Dizziness   Diarrhea   Chills   Generalized Body Aches   Dysuria   Abdominal Pain    HPI Kaitlyn Maldonado is a 58 y.o. female.   58 year old female presents today with multiple symptoms.  Reporting some dizziness, diarrhea, chills, body aches started yesterday.  She has had some congestion over the past week or so.  Also having some mild dysuria and right sided flank pain for approximately 1 month.  No fever noted.   Dizziness Associated symptoms: diarrhea   Diarrhea Associated symptoms: abdominal pain   Dysuria Associated symptoms: abdominal pain   Abdominal Pain Associated symptoms: diarrhea and dysuria     Past Medical History:  Diagnosis Date   Acid reflux    Allergic rhinitis    Anxiety    Arthritis    Asthma    Fibrocystic disease of breast    Iron deficiency anemia    Low back pain     Patient Active Problem List   Diagnosis Date Noted   Allergic rhinitis 05/19/2014   GERD (gastroesophageal reflux disease) 04/14/2014   DOE (dyspnea on exertion) 04/14/2014    Past Surgical History:  Procedure Laterality Date   CHOLECYSTECTOMY  2007   complete hysterectomy  2008    OB History   No obstetric history on file.      Home Medications    Prior to Admission medications   Medication Sig Start Date End Date Taking? Authorizing Provider  azithromycin (ZITHROMAX) 250 MG tablet Take 2 tablets (500 mg total) by mouth daily for 1 day, THEN 1 tablet (250 mg total) daily for 4 days. 02/19/24 02/24/24 Yes Lalani Winkles A, FNP  meclizine (ANTIVERT) 25 MG tablet Take by mouth. 02/15/17  Yes [provider]  albuterol (PROAIR HFA) 108 (90 BASE) MCG/ACT inhaler Inhale 2 puffs into the lungs every 6 (six) hours as needed for wheezing or shortness of breath.    [provider]  budesonide-formoterol  (SYMBICORT) 80-4.5 MCG/ACT inhaler Inhale into the lungs.    [provider]  Cholecalciferol (VITAMIN D) 2000 UNITS CAPS Take by mouth daily.    [provider]  dicyclomine (BENTYL) 20 MG tablet  03/28/16   [provider]  Fe Fum-FePoly-Vit C-Vit B3 (INTEGRA PO) Take by mouth daily.    [provider]  loratadine (CLARITIN) 10 MG tablet Take 10 mg by mouth 2 (two) times daily.    [provider]  meloxicam (MOBIC) 7.5 MG tablet Take 7.5 mg by mouth 2 (two) times daily.    [provider]  montelukast (SINGULAIR) 10 MG tablet Take 10 mg by mouth at bedtime.    [provider]  Omega-3 Fatty Acids (FISH OIL) 1200 MG CAPS Take by mouth 2 (two) times daily.    [provider]  pantoprazole (PROTONIX) 40 MG tablet  03/28/16   [provider]  Respiratory Therapy Supplies (FLUTTER) DEVI Use as directed four times daily 04/14/14   Alyson Reedy, MD  SYNTHROID 50 MCG tablet  03/28/16   [provider]    Family History Family History  Problem Relation Age of Onset   Emphysema Father    Allergies Mother    Asthma Mother    Skin cancer Brother    Liver cancer Paternal Uncle    Liver  cancer Paternal Grandfather     Social History Social History   Tobacco Use   Smoking status: Former    Current packs/day: 0.00    Average packs/day: 2.0 packs/day for 12.0 years (24.0 ttl pk-yrs)    Types: Cigarettes    Start date: 12/11/1985    Quit date: 12/11/1997    Years since quitting: 26.2   Smokeless tobacco: Never  Vaping Use   Vaping status: Never Used  Substance Use Topics   Alcohol use: Yes    Comment: occasional at dinner time    Drug use: No     Allergies   Keflex [cephalexin] and Sulfa antibiotics   Review of Systems Review of Systems  Gastrointestinal:  Positive for abdominal pain and diarrhea.  Genitourinary:  Positive for dysuria.  Neurological:  Positive for dizziness.     Physical  Exam Triage Vital Signs ED Triage Vitals  Encounter Vitals Group     BP 02/19/24 1312 139/84     Systolic BP Percentile --      Diastolic BP Percentile --      Pulse Rate 02/19/24 1312 61     Resp 02/19/24 1312 18     Temp 02/19/24 1312 (!) 97.5 F (36.4 C)     Temp Source 02/19/24 1312 Oral     SpO2 02/19/24 1312 98 %     Weight --      Height --      Head Circumference --      Peak Flow --      Pain Score 02/19/24 1315 6     Pain Loc --      Pain Education --      Exclude from Growth Chart --    No data found.  Updated Vital Signs BP 139/84 (BP Location: Right Arm)   Pulse 61   Temp (!) 97.5 F (36.4 C) (Oral)   Resp 18   LMP  (LMP Unknown)   SpO2 98%   Visual Acuity Right Eye Distance:   Left Eye Distance:   Bilateral Distance:    Right Eye Near:   Left Eye Near:    Bilateral Near:     Physical Exam Constitutional:      General: She is not in acute distress.    Appearance: She is well-developed. She is not ill-appearing or toxic-appearing.  HENT:     Head: Normocephalic and atraumatic.     Right Ear: Tympanic membrane and ear canal normal.     Left Ear: Tympanic membrane and ear canal normal.     Mouth/Throat:     Pharynx: Oropharynx is clear.  Eyes:     Conjunctiva/sclera: Conjunctivae normal.  Cardiovascular:     Rate and Rhythm: Normal rate and regular rhythm.     Heart sounds: Normal heart sounds.  Pulmonary:     Effort: Pulmonary effort is normal.     Breath sounds: Normal breath sounds.  Musculoskeletal:        General: Normal range of motion.       Back:     Comments: Mild TTP   Skin:    General: Skin is warm and dry.  Neurological:     Mental Status: She is alert.  Psychiatric:        Mood and Affect: Mood normal.      UC Treatments / Results  Labs (all labs ordered are listed, but only abnormal results are displayed) Labs Reviewed  POCT URINALYSIS DIP (MANUAL ENTRY) - Abnormal; Notable for  the following components:       Result Value   Blood, UA trace-intact (*)    All other components within normal limits  POC COVID19/FLU A&B COMBO - Normal  URINE CULTURE    EKG   Radiology No results found.  Procedures Procedures (including critical care time)  Medications Ordered in UC Medications - No data to display  Initial Impression / Assessment and Plan / UC Course  I have reviewed the triage vital signs and the nursing notes.  Pertinent labs & imaging results that were available during my care of the patient were reviewed by me and considered in my medical decision making (see chart for details).     Flank pain-trace blood on urine but no evidence of infection.  Will culture.  Does have history of kidney stones.  Recommend monitor and push fluids Dizziness, acute sinusitis-treating with antibiotics and recommend over-the-counter Mucinex sinus or Tylenol sinus for symptoms. Also recommend rest, hydration and follow-up as needed Final Clinical Impressions(s) / UC Diagnoses   Final diagnoses:  Dysuria  Dizziness  Acute sinusitis, recurrence not specified, unspecified location  Flank pain     Discharge Instructions      All of the testing here was negative today.  Sending an antibiotic for sinus infection.  We will culture the urine and treat as needed.  Recommend Mucinex sinus to help with symptoms. Rest/Hydrate Follow up as needed.     ED Prescriptions     Medication Sig Dispense Auth. Provider   azithromycin (ZITHROMAX) 250 MG tablet Take 2 tablets (500 mg total) by mouth daily for 1 day, THEN 1 tablet (250 mg total) daily for 4 days. 6 tablet Janace Aris, FNP      PDMP not reviewed this encounter.   Janace Aris, FNP 02/19/24 1410

## 2024-02-19 NOTE — Discharge Instructions (Signed)
 All of the testing here was negative today.  Sending an antibiotic for sinus infection.  We will culture the urine and treat as needed.  Recommend Mucinex sinus to help with symptoms. Rest/Hydrate Follow up as needed.

## 2024-02-19 NOTE — ED Triage Notes (Addendum)
 Patient presents with c/o dizziness, diarrhea, chills and body aches since yesterday.  Patient also c/o dysuria and right sided abdominal pain x 1 month.

## 2024-02-20 LAB — URINE CULTURE
